# Patient Record
Sex: Female | Born: 1975 | Race: White | Hispanic: Yes | Smoking: Never smoker
Health system: Southern US, Community
[De-identification: ages and names within clinical notes are randomized; demographics above are authoritative.]

## PROBLEM LIST (undated history)

## (undated) HISTORY — PX: LEG SURGERY: SHX1003

---

## 2012-05-29 ENCOUNTER — Encounter: Payer: Self-pay | Admitting: *Deleted

## 2012-05-29 ENCOUNTER — Other Ambulatory Visit: Payer: Self-pay | Admitting: Obstetrics and Gynecology

## 2012-05-29 ENCOUNTER — Ambulatory Visit (HOSPITAL_COMMUNITY)
Admission: RE | Admit: 2012-05-29 | Discharge: 2012-05-29 | Disposition: A | Discharge status: Home / Self Care | Payer: Self-pay | Source: Ambulatory Visit | Attending: Obstetrics and Gynecology | Admitting: Obstetrics and Gynecology

## 2012-05-29 ENCOUNTER — Ambulatory Visit (INDEPENDENT_AMBULATORY_CARE_PROVIDER_SITE_OTHER): Payer: Self-pay | Admitting: *Deleted

## 2012-05-29 VITALS — BP 134/90 | HR 57 | Temp 98.3°F | Ht 61.0 in | Wt 148.6 lb

## 2012-05-29 DIAGNOSIS — Z1231 Encounter for screening mammogram for malignant neoplasm of breast: Secondary | ICD-10-CM

## 2012-05-29 DIAGNOSIS — Z1239 Encounter for other screening for malignant neoplasm of breast: Secondary | ICD-10-CM

## 2012-05-29 NOTE — Patient Instructions (Signed)
Taught patient how to perform BSE. Unable to complete patients Pap smear today due to being on menstrual cycle. Told patient will call to schedule her a Pap smear through [facility] this fall. Let her know [facility] will cover Pap smears every 3 years unless has a history of abnormal Pap smears. Patient escorted to mammography for a screening mammogram. Let patient know will follow up with her within the next couple weeks with results. Patient verbalized understanding.

## 2012-05-29 NOTE — Progress Notes (Signed)
No complaints today.  Pap Smear:   Pap smear not performed today. Per patient last Pap smear was 3 years ago and normal. Unable to complete Pap smear today due to patient being on menstrual cycle. Per patient she has no history of abnormal Pap smears. No Pap smear results in EPIC.  Physical exam: Breasts Breasts symmetrical. No skin abnormalities bilateral breasts. No nipple retraction bilateral breasts. No nipple discharge bilateral breasts. No lymphadenopathy. No lumps palpated bilateral breasts. No complaints of pain or tenderness on exam. Patient has a sister who was diagnosed at 81 with breast cancer and her mother was diagnosed with breast cancer in her 89's.    Pelvic/Bimanual No Pap smear completed today since patient was on menstrual cycle. Will schedule patient Pap smear through [facility] this fall.

## 2012-06-08 ENCOUNTER — Other Ambulatory Visit: Payer: Self-pay | Admitting: Obstetrics and Gynecology

## 2012-06-08 DIAGNOSIS — R928 Other abnormal and inconclusive findings on diagnostic imaging of breast: Secondary | ICD-10-CM

## 2012-06-10 ENCOUNTER — Telehealth: Payer: Self-pay | Admitting: *Deleted

## 2012-06-10 NOTE — Telephone Encounter (Signed)
Telephoned patient and left message to return call [phone].

## 2012-06-15 ENCOUNTER — Ambulatory Visit
Admission: RE | Admit: 2012-06-15 | Discharge: 2012-06-15 | Disposition: A | Discharge status: Home / Self Care | Payer: No Typology Code available for payment source | Source: Ambulatory Visit | Attending: Obstetrics and Gynecology | Admitting: Obstetrics and Gynecology

## 2012-06-15 DIAGNOSIS — R928 Other abnormal and inconclusive findings on diagnostic imaging of breast: Secondary | ICD-10-CM

## 2012-06-18 ENCOUNTER — Telehealth: Payer: Self-pay | Admitting: *Deleted

## 2012-06-18 NOTE — Telephone Encounter (Signed)
Used interpreter Hannah Luna. Telephoned patient at home # and discussed results of mammogram and follow up mammogram in one year. Scheduled pap smear with [facility] Sept 3 9:45. Patient voiced understanding.

## 2012-08-19 ENCOUNTER — Encounter: Payer: Self-pay | Admitting: *Deleted

## 2012-09-01 ENCOUNTER — Other Ambulatory Visit (HOSPITAL_COMMUNITY)
Admission: RE | Admit: 2012-09-01 | Discharge: 2012-09-01 | Disposition: A | Discharge status: Home / Self Care | Payer: No Typology Code available for payment source | Source: Ambulatory Visit | Attending: Obstetrics and Gynecology | Admitting: Obstetrics and Gynecology

## 2012-09-01 ENCOUNTER — Ambulatory Visit (HOSPITAL_COMMUNITY)
Admission: RE | Admit: 2012-09-01 | Discharge: 2012-09-01 | Disposition: A | Discharge status: Home / Self Care | Payer: Self-pay | Source: Ambulatory Visit | Attending: Obstetrics and Gynecology | Admitting: Obstetrics and Gynecology

## 2012-09-01 ENCOUNTER — Other Ambulatory Visit: Payer: Self-pay | Admitting: Obstetrics and Gynecology

## 2012-09-01 DIAGNOSIS — Z01419 Encounter for gynecological examination (general) (routine) without abnormal findings: Secondary | ICD-10-CM | POA: Insufficient documentation

## 2012-09-10 ENCOUNTER — Telehealth (HOSPITAL_COMMUNITY): Payer: Self-pay | Admitting: *Deleted

## 2012-09-10 NOTE — Telephone Encounter (Signed)
Telephoned patient at home # and discussed results of pap smear. Advised pap smear was normal. Patient voiced understanding.

## 2012-10-19 NOTE — Progress Notes (Signed)
See detailed notes scanned under media. 

## 2012-10-19 NOTE — Patient Instructions (Signed)
See detailed notes scanned under media. 

## 2012-12-09 NOTE — Addendum Note (Signed)
Encounter addended by: Saintclair Halsted, RN on: 12/09/2012  2:28 PM<BR>     Documentation filed: Visit Diagnoses, Charges VN

## 2014-05-12 ENCOUNTER — Ambulatory Visit: Payer: Self-pay

## 2014-05-12 ENCOUNTER — Ambulatory Visit: Payer: Self-pay | Admitting: Family Medicine

## 2014-05-12 VITALS — BP 110/76 | HR 73 | Temp 98.2°F | Resp 16 | Ht 61.0 in | Wt 155.4 lb

## 2014-05-12 DIAGNOSIS — M545 Low back pain, unspecified: Secondary | ICD-10-CM

## 2014-05-12 DIAGNOSIS — M549 Dorsalgia, unspecified: Secondary | ICD-10-CM

## 2014-05-12 MED ORDER — OXYCODONE-ACETAMINOPHEN 5-325 MG PO TABS: 1.0000 | ORAL_TABLET | ORAL | Status: DC | PRN

## 2014-05-12 MED ORDER — PREDNISONE 20 MG PO TABS: ORAL_TABLET | ORAL | Status: AC

## 2014-05-12 NOTE — Patient Instructions (Signed)

## 2014-05-12 NOTE — Progress Notes (Addendum)
This chart was scribed for Norberto SorensonEva Jadon Ressler, MD by Beverly MilchJ Harrison Collins, ED Scribe. This patient was seen in room 8 and the patient's care was started at 1:37 PM.  Subjective:    Patient ID: Hannah Luna, female    DOB: 11/12/1976, 38 y.o.   MRN: [id number]  Chief Complaint  Patient presents with  . Back Pain    x 6 days    HPI HPI Comments: Hannah Norma Fredricksonoledo is a 38 y.o. female who presents to the [facility] complaining of lower back pain that began 6 days ago after she slipped on oil falling in the bathroom at work and landing on her back 6 days ago. She reports some tingling in her right lower legs. Pt states her pain worsens with walking specifically when her heel hits the ground. She reports taking Advil and Flanax without significant improvement. 04/23/2014 was her last period.  History reviewed. No pertinent past medical history. Current Outpatient Prescriptions on File Prior to Visit  Medication Sig Dispense Refill  . Multiple Vitamin (MULTIVITAMIN) tablet Take 1 tablet by mouth daily.       No current facility-administered medications on file prior to visit.   No Known Allergies  Review of Systems  Constitutional: Negative for fever and chills.  Gastrointestinal: Negative for nausea, vomiting, abdominal pain, diarrhea and constipation.  Genitourinary: Negative for urgency, frequency, decreased urine volume and difficulty urinating.  Musculoskeletal: Positive for back pain and myalgias. Negative for arthralgias, gait problem and joint swelling.  Neurological: Positive for numbness. Negative for weakness.  Hematological: Negative for adenopathy. Does not bruise/bleed easily.       Objective:   Physical Exam  Nursing note and vitals reviewed. Constitutional: She is oriented to person, place, and time. She appears well-developed and well-nourished. No distress.  HENT:  Head: Normocephalic and atraumatic.  Eyes: Conjunctivae and EOM are normal. Pupils are equal,  round, and reactive to light. No scleral icterus.  Neck: Normal range of motion. Neck supple. No thyromegaly present.  Cardiovascular: Exam reveals no friction rub.   Pulmonary/Chest: Effort normal. No stridor.  Abdominal: Soft.  Musculoskeletal: Normal range of motion. She exhibits no edema.       Lumbar back: She exhibits tenderness (over the spinous process and paraspinal muscles).  Lymphadenopathy:    She has no cervical adenopathy.  Neurological: She is alert and oriented to person, place, and time. She has normal strength. No cranial nerve deficit. She exhibits normal muscle tone. Coordination normal.  Reflex Scores:      Patellar reflexes are 2+ on the right side and 2+ on the left side.      Achilles reflexes are 2+ on the right side and 2+ on the left side. 5/5 strength bilaterally in the lower extremities  Skin: Skin is warm and dry. No rash noted. She is not diaphoretic. No erythema.  Psychiatric: She has a normal mood and affect. Her behavior is normal.     Triage vitals: BP 110/76  Pulse 73  Temp(Src) 98.2 F (36.8 C) (Oral)  Resp 16  Ht 5\' 1"  (1.549 m)  Wt 155 lb 6.4 oz (70.489 kg)  BMI 29.38 kg/m2  SpO2 100%   [facility] reading (PRIMARY) by  Dr. Clelia CroftShaw. L spine: no acute abnormality     Assessment & Plan:   Back pain - Plan: DG Lumbar Spine 2-3 Views  Lumbago  Meds ordered this encounter  Medications  . predniSONE (DELTASONE) 20 MG tablet    Sig:  Take 3 tabs qd x 3d, then 2 tabs x 3d, then 1 tab po qd x 3d.    Dispense:  18 tablet    Refill:  0  . oxyCODONE-acetaminophen (ROXICET) 5-325 MG per tablet    Sig: Take 1-2 tablets by mouth every 4 (four) hours as needed for severe pain.    Dispense:  40 tablet    Refill:  0    I personally performed the services described in this documentation, which was scribed in my presence. The recorded information has been reviewed and considered, and addended by me as needed.  Norberto SorensonEva Lashawna Poche, MD MPH

## 2014-05-19 ENCOUNTER — Ambulatory Visit: Payer: Self-pay | Admitting: Family Medicine

## 2014-05-19 VITALS — BP 116/78 | HR 78 | Temp 98.4°F | Resp 16 | Ht 61.5 in | Wt 154.6 lb

## 2014-05-19 DIAGNOSIS — M549 Dorsalgia, unspecified: Secondary | ICD-10-CM

## 2014-05-19 DIAGNOSIS — M545 Low back pain, unspecified: Secondary | ICD-10-CM

## 2014-05-19 DIAGNOSIS — S39012A Strain of muscle, fascia and tendon of lower back, initial encounter: Secondary | ICD-10-CM

## 2014-05-19 MED ORDER — MELOXICAM 7.5 MG PO TABS: 7.5000 mg | ORAL_TABLET | Freq: Two times a day (BID) | ORAL | Status: AC

## 2014-05-19 MED ORDER — CYCLOBENZAPRINE HCL 10 MG PO TABS: 10.0000 mg | ORAL_TABLET | Freq: Three times a day (TID) | ORAL | Status: AC | PRN

## 2014-05-19 NOTE — Progress Notes (Signed)
Subjective:    Patient ID: Hannah Luna, female    DOB: 05/24/1976, 38 y.o.   MRN: [id number] This chart was scribed for Norberto SorensonEva Shaw, MD by Nicholos Johnsenise Iheanachor, Medical Scribe. The patient was seen in room 14. This patient's care was started at 3:30 PM.  Chief Complaint  Patient presents with  . Follow-up    back pain   HPI HPI Comments: Hannah Luna is a 38 y.o. female who presents to the [facility] for back pain follow up. Seen 7 days previously with lower back pain that began 6 days prior to visit following a fall while at work in the [employer]. Reported tingling in right lower legs. Was placed on Prednisone and Oxycodone to treat. Lumbar spine x-rays performed here 1 week ago in the office that showed normal.  Today presents with same unchanged pain. Reports trouble sleeping with Prednisone. Oxycodone helps minimally then wears off. Last taken yesterday. Is back at work. Went to work for the past 2 days but was told to come here today to receive a list of things she can and cannot do at work. Is not using heat or ice to treat symptoms. Denies bowel/bladder trouble, abdominal pain.   There are no active problems to display for this patient.  No past medical history on file. Past Surgical History  Procedure Laterality Date  . Leg surgery     No Known Allergies Prior to Admission medications   Medication Sig Start Date End Date Taking? Authorizing Provider  Multiple Vitamin (MULTIVITAMIN) tablet Take 1 tablet by mouth daily.   Yes Historical Provider, MD  oxyCODONE-acetaminophen (ROXICET) 5-325 MG per tablet Take 1-2 tablets by mouth every 4 (four) hours as needed for severe pain. 05/12/14  Yes Sherren MochaEva N Shaw, MD  predniSONE (DELTASONE) 20 MG tablet Take 3 tabs qd x 3d, then 2 tabs x 3d, then 1 tab po qd x 3d. 05/12/14  Yes Sherren MochaEva N Shaw, MD   History   Social History  . Marital Status: Unknown    Spouse Name: N/A    Number of Children: N/A  . Years of Education: N/A    Occupational History  . Not on file.   Social History Main Topics  . Smoking status: Never Smoker   . Smokeless tobacco: Never Used  . Alcohol Use: No  . Drug Use: No  . Sexual Activity: Yes    Birth Control/ Protection: None   Other Topics Concern  . Not on file   Social History Narrative  . No narrative on file   Review of Systems  Constitutional: Negative for fever and chills.  HENT: Negative for rhinorrhea and sore throat.   Respiratory: Negative for cough and shortness of breath.   Gastrointestinal: Negative for abdominal pain.  Musculoskeletal: Positive for back pain and myalgias.  Skin: Negative for color change and rash.  Neurological: Negative for weakness and headaches.  Psychiatric/Behavioral: Negative for behavioral problems.   BP 116/78  Pulse 78  Temp(Src) 98.4 F (36.9 C) (Oral)  Resp 16  Ht 5' 1.5" (1.562 m)  Wt 154 lb 9.6 oz (70.126 kg)  BMI 28.74 kg/m2  SpO2 100% Objective:  Physical Exam  Nursing note and vitals reviewed. Constitutional: She is oriented to person, place, and time. She appears well-developed and well-nourished. No distress.  HENT:  Head: Normocephalic and atraumatic.  Eyes: EOM are normal.  Neck: Neck supple. No tracheal deviation present.  Cardiovascular: Normal rate.   Pulmonary/Chest: Effort normal. No  respiratory distress.  Musculoskeletal: Normal range of motion.       Lumbar back: She exhibits tenderness and spasm.  Point tenderness over lower lumbar spine and paraspinal with muscle spasms. 5/5 strength bilaterally on all lower extremities.   Neurological: She is alert and oriented to person, place, and time.  Reflex Scores:      Patellar reflexes are 2+ on the right side and 2+ on the left side.      Achilles reflexes are 2+ on the right side and 2+ on the left side. Skin: Skin is warm and dry.  Psychiatric: She has a normal mood and affect. Her behavior is normal.   Assessment & Plan:   Lumbar strain  Meds  ordered this encounter  Medications  . cyclobenzaprine (FLEXERIL) 10 MG tablet    Sig: Take 1 tablet (10 mg total) by mouth 3 (three) times daily as needed for muscle spasms.    Dispense:  30 tablet    Refill:  0  . meloxicam (MOBIC) 7.5 MG tablet    Sig: Take 1 tablet (7.5 mg total) by mouth 2 (two) times daily.    Dispense:  60 tablet    Refill:  0    I personally performed the services described in this documentation, which was scribed in my presence. The recorded information has been reviewed and considered, and addended by me as needed.  Norberto SorensonEva Shaw, MD MPH

## 2014-05-19 NOTE — Patient Instructions (Signed)
Start the flexeril as needed for muscle spasms.  AFTER the course of prednisone is complete, start taking the meloxicam daily.  Make sure you are placing heat against your low back 20 minutes 4 times/d.  Gentle stretching and massage.  If you are worsening come back immediately. If you are still having any pain in 1 month, please come back as we may need to discuss specialist referral or physical therapy or further imaging Distensin de la cintura con rehabilitacin (Low Back Strain with Rehab) Hannah Luna distensin es una lesin en la que el tendn o msculo se rasga. Los msculos y tendones de la cintura son susceptibles de sufrir distensiones. Sin embargo, estos msculos y tendones son Lynnae Sandhoffmuy fuertes y se requiere de una gran fuerza para lesionarlos. Los esguinces se clasifican en tres categoras. En el esguince de grado 1 hay dolor, pero el tendn no est estirado. En los esguinces de grado 2 hay un ligamento alargado, debido a un estiramiento o desgarro parcial. En el esguince de grado 2 an hay funcionamiento, aunque ste puede disminuir. Una distensin en grado 3 es la ruptura completa del msculo o el tendn, y suele quedar incapacitada la funcin. SNTOMAS  Dolor en la cintura.  Dolor en la espalda, que a menudo afecta ms a un lado que al CIGNAotro.  Dolor que empeora con los movimientos y se siente en las caderas, las nalgas o la zona posterior del muslo.  Espasmos en los msculos de la espalda.  Hinchazn a lo largo de los msculos de la espalda.  Prdida de la fuerza en estos msculos.  Ruido de "crack" (crepitacin) al tocarlos. CAUSAS  La distensin se produce cuando se coloca una fuerza en el msculo o tendn que es mayor de lo que puede soportar. Las causas ms frecuentes de la lesin son:  Benedetto GoadUso excesivo prolongado de la unidad msculo - tendn en la zona de la cintura, generalmente debido a Landscape architectuna postura incorrecta.  Una nica lesin o fuerza violenta aplicada en la espalda. LOS RIESGOS  AUMENTAN CON  Cualquier deporte en el que el movimiento ocasione una fuerza de giro en la espalda o una gran inclinacin de la cintura; (ftbol americano, rugby, levantamiento de pesas, bowling, golf, tenis, patinaje, deportes con raqueta, natacin, carrera, gimnasia, clavados).  Poca fuerza y flexibilidad.  No hacer un precalentamiento adecuado.  Historia familiar de dolor de cintura o trastornos en discos.  Cirugas previas en la espalda (especialmente fusin).  Tcnica incorrecta al levantar objetos pesados.  Permanecer largo tiempo sentado, especialmente de manera incorrecta. MEDIDAS DE PREVENCIN  Aprenda y Child psychotherapistutilice la mecnica correcta al sentarse o al levantarse (mantener la postura correcta al sentarse; levantar objetos inclinando las rodillas y las piernas y no la cintura).  Precalentamiento adecuado y elongacin antes de la Loomisactividad.  Descanso y recuperacin entre actividades.  Mantener la forma fsica:  Earma ReadingFuerza, flexibilidad y resistencia muscular.  Capacidad cardiovascular. PRONSTICO Si se trata adecuadamente, generalmente es curable dentro de las 6 semanas. POSIBLES COMPLICACIONES:  La recurrencia frecuente de los sntomas puede dar como resultado un problema crnico.  La inflamacin crnica, degeneracin cicatrizal del tendn y ruptura parcial del tendn.  Demora de la curacin o de la resolucin de los sntomas.  Discapacidad prolongada. CONSIDERACIONES CSX CorporationENERALES PARA EL TRATAMIENTO El tratamiento inicial incluye el uso de medicamentos y la aplicacin de hielo para reducir Chief Technology Officerel dolor y la inflamacin. Los ejercicios de elongacin y fortalecimiento pueden ayudar a reducir Chief Technology Officerel dolor con la Calexicoactividad. Los ejercicios pueden Insurance claims handlerrealizarse en el  hogar o con un terapeuta. Los Liz Claibornecasos graves pueden requerir la derivacin a un fisioterapeuta para Magazine features editorrealizar una evaluacin y Games developercomenzar un tratamiento, como ultrasonido. El profesional podr indicarle el uso de un dispositivo  ortopdico para ayudar a Glass blower/designerreducir el dolor y la inflamacin. A menudo, demasiado reposo en cama podr resultar en ms daos que beneficios. Podrn prescribirle inyecciones de corticoides. Sin embargo, esto deber reservarse para los casos ms graves. Es Therapist, occupationalimportante evitar el uso de la espalda cuando se levantan objetos. Por la noche, se aconseja que usted Djiboutiduerma boca arriba, sobre un [address]colchn firme y coloque una almohada debajo de las rodillas. Si no se obtiene xito con Artistel tratamiento conservador, ser necesario someterse a [place] and Herzegovinauna ciruga.  MEDICAMENTOS   Si es necesaria la administracin de medicamentos para Chief Technology Officerel dolor, se recomiendan los antiinflamatorios no esteroides, como aspirina e ibuprofeno y otros calmantes menores, como acetaminofeno.  No tome medicamentos para el dolor dentro de los [address] das previos a la Azerbaijanciruga.  El profesional podr prescribirle calmantes si lo considera necesario. Utilcelos como se le indique y slo cuando lo necesite.  Podr beneficiarse con Teachers Insurance and Annuity Associationpomadas sobre la piel.  En algunos casos se indica una inyeccin de corticosteroides. Estas inyecciones deben reservarse para los New Brendacasos graves, porque slo se pueden administrar una determinada cantidad de veces. CALOR Y FRO   El fro (con hielo) debe aplicarse durante 10 a 15 minutos cada 2  3 horas para reducir la inflamacin y Chief Technology Officerel dolor e inmediatamente despus de cualquier actividad que agrava los sntomas. Utilice bolsas o un masaje de hielo.  El calor puede usarse antes de Therapist, musicelongar y de las actividades de fortalecimiento indicadas por el profesional, le fisioterapeuta o Orthoptistel entrenador. Utilice una bolsa trmica o un pao hmedo. SOLICITE ATENCIN MDICA SI:   Los sntomas empeoran o no mejoran en 2 a 4 semanas, an realizando Pharmacist, communityun tratamiento.  Siente adormecimiento, debilitamiento o prdida de control de la vejiga o el intestino.  Desarrolla nuevos e inexplicables sntomas. (Los medicamentos indicados en el tratamiento le  ocasionan efectos secundarios). EJERCICIOS  EJERCICIOS DE AMPLITUD DE MOVIMIENTOS Cherlynn JuneY ELONGACIN Distensin de cintura La mayora de las personas con dolor de espalda baja encuentran que sus sntomas empeoran al doblarse hacia adelante (flexin) o al arquear la regin inferior de la espalda (extensin). Los ejercicios que le ayudarn a Oncologistresolver sus sntomas se Research scientist (life sciences)centrarn en el movimiento contrario.  El mdico, fisioterapeuta o Research scientist (physical sciences)entrenador le ayudarn a Chief Strategy Officerdeterminar qu ejercicios sern de ayuda para resolver su dolor de espalda. No realice ningn ejercicio sin consultarlo antes con el profesional. Discontinue los ejercicios que empeoran sus sntomas, hasta que hable con el mdico. Si siente dolor, entumecimiento u hormigueo que Texas Instrumentsirradia hacia los glteos, piernas o pies, el objetivo de esta terapia es que estos sntomas se acerquen a la espalda y Customer service managereventualmente desaparezcan. A veces, estos sntomas en las piernas mejoran, pero el dolor de espalda empeora. Este suele ser un indicio de progreso en su rehabilitacin. Asegrese de que estar atento a cualquier cambio en sus sntomas y las actividades que ha KB Home	[place] Angeleshecho en las 24 horas antes del cambio. Compartir esta informacin con su mdico le permitir mayor eficiencia para tratar su enfermedad.  Estos ejercicios le ayudarn en la recuperacin de la lesin. Los sntomas podrn aliviarse con o sin asistencia adicional de su mdico, fisioterapeuta o Herbalistentrenador. Al completar estos ejercicios, recuerde:  Restaurar la flexibilidad del tejido ayuda a que las articulaciones recuperen el movimiento normal. Esto permite que el movimiento y la Beale AFBactividad  sea ms saludables y [employer].  Para que sea efectiva, cada elongacin debe realizarse durante al menos 30 segundos.  La elongacin nunca debe ser dolorosa. Deber sentir slo un alargamiento o distensin suave del tejido que estira. EJERCICIOS DE AMPLITUD DE MOVIMIENTOS Y ELONGACIN: ELONGACION Flexin - una rodilla al  pecho  Recustese en una cama dura o sobre el piso, con ambas piernas extendidas al frente.  Manteniendo una pierna en contacto con el piso, lleve la rodilla opuesta al pecho. Mantenga la pierna en esa posicin, sostenindola por la zona posterior del muslo o por la rodilla.  Presione hasta sentir un suave estiramiento en la cintura. Mantenga esta posicin durante __________ segundos.  Libere la pierna lentamente y repita el ejercicio con el lado opuesto. Reptalo __________ veces. Realice este ejercicio __________ veces por da.  ELONGACIN Flexin, dos rodillas al pecho   Recustese en una cama dura o sobre el piso, con ambas piernas extendidas al frente.  Manteniendo una pierna en contacto con el piso, lleve la rodilla opuesta al pecho.  Tense los msculos del estmago para apoyar la espalda y levante la otra rodilla Joseph. Mantenga las piernas en su lugar y tmese por detrs de las caderas o las rodillas.  Con ambas rodillas en el pecho, tire hasta que sienta un estiramiento en la parte trasera de la espalda. Mantenga esta posicin durante __________ segundos.  Tense los msculos del estmago y baje las piernas de a una por vez. Reptalo __________ veces. Realice este ejercicio __________ veces por da.  ELONGACIN Rotacin de la zona baja del tronco  Recustese sobre una cama firme o sobre el suelo. Mantenga las piernas al frente, doble las rodillas de modo que ambas apunten hacia el techo y los pies queden bien apoyados en el piso.  Extienda los brazos a Dance movement psychotherapist. Esto estabilizar la zona superior del cuerpo, manteniendo los hombros en contacto con el piso.  Suave y lentamente deje caer ambas rodillas juntas hacia un lado, hasta sentir un ligero estiramiento en la cintura. Mantenga esta posicin durante __________ segundos.  Tensione los Exelon Corporation del estmago para Occupational psychologist la cintura mientras lleva las rodillas nuevamente a la posicin inicial. Repita el ejercicio hacia  el otro lado. Reptalo __________ veces. Realice este ejercicio __________ veces por da. EJERCICIOS DE AMPLITUD DE MOVIMIENTOS Y FLEXIBILIDAD: ELONGACIN Extensin posicin prona sobre los codos  Acustese sobre el estmago sobre el piso, una cama ser muy blanda. Coloque las palmas a una distancia igual al ancho de los hombres y a la altura de la cabeza.  Coloque los codos bajo los hombros. Si siente dolor, colquese almohadas debajo del pecho.  Deje que su cuerpo se relaje, de modo que las caderas queden ms abajo y tengan ms contacto con el piso.  Mantenga esta posicin durante __________ segundos.  Vuelva lentamente a la posicin plana sobre el piso. Reptalo __________ veces. Realice este ejercicio __________ veces por da.  AMPLITUD DE MOVIMIENTOS - Extensin - flexin de brazos en posicin prona  Acustese sobre el [employer] Home	[place] piso, una cama ser Amity. Coloque las palmas a una distancia igual al ancho de los hombres y a la altura de la cabeza.  Mantenga la espalda tan relajada como pueda, enderece lentamente los codos [address] las caderas contra el suelo. Puede modificar la posicin de las manos para estar ms cmodo. A medida que gana movimiento, sus manos quedarn ms por debajo de los hombros.  Mantenga cada posicin durante __________segundos.  Vuelva lentamente a la posicin plana sobre el piso. Reptalo __________ veces. Realice este ejercicio __________ veces por da.  AMPLITUD DE MOVIMIENTOS Cuadrpedo [place] vertebral neutral  Coloque las manos y las rodillas en una superficie firme. Las manos deben quedar a la altura de los hombros y las rodillas debajo de las caderas. Puede colocar algo debajo las rodillas para estar ms cmodo.  Haga caer la cabeza y apunte el cccix hacia el suelo debajo de usted. De este modo se redondear la cintura, en [place] similar a un gato enojado. Mantenga esta posicin durante __________ segundos.  Lentamente  levante la cabeza y afloje el cccix para que se hunda el cuerpo en un gran arco, como un caballo.  Mantenga esta posicin durante __________ segundos.  Reptalo hasta sentir calor en la cintura.  Ahora encuentre su "punto ideal". Ser la posicin ms cmoda [employer]. En esta posicin es cuando su columna est neutral. Una vez que encuentre la posicin, tensione los msculos del estmago para sostener la zona inferior de la espalda.  Mantenga esta posicin durante __________ segundos. Reptalo __________ veces. Realice este ejercicio __________ veces por da.  EJERCICIOS DE FORTALECIMIENTO - Distensin de la cintura Estos ejercicios le ayudarn en la recuperacin de la lesin. Estos ejercicios deben hacerse cerca de su "punto dulce". Este es el arco neutro, de la parte baja de la espalda, en algn lugar entre la posicin completamente redondeada y arqueada plenamente, que es la posicin menos dolorosa. Cuando se realiza en [employer] de seguridad del movimiento, estos ejercicios se pueden Chemical engineer para las personas que tienen una lesin basada en flexin o extensin. Con estos ejercicios, los sntomas podrn desaparecer con o sin mayor intervencin del profesional, el fisioterapeuta o Orthoptist. Al completar estos ejercicios, recuerde:   Los msculos pueden ganar la resistencia y la fuerza necesarias para las actividades diarias a travs de ejercicios controlados.  Realice los ejercicios como se lo indic el mdico, el fisioterapeuta o Orthoptist. Aumente la resistencia y las repeticiones segn se le haya indicado.  Podr experimentar dolor o cansancio muscular, pero el dolor o molestia que trata de eliminar a travs de los ejercicios nunca debe empeorar. Si el dolor empeora, detngase y asegrese de que est siguiendo las directivas correctamente. Si an siente dolor luego de Education officer, environmental lo ajustes necesarios, deber discontinuar el ejercicio hasta que pueda conversar  con el profesional sobre el problema. FORTALECIMIENTO - Abdominales profundos - Inclinacin plvica  Recustese sobre una cama firme o sobre el suelo. Mantenga las piernas al frente, doble las rodillas de modo que ambas apunten hacia el techo y los pies queden bien apoyados en el piso.  Tensione la zona baja de los msculos abdominales para presionar la Oncologist. Este movimiento har rotar su pelvis de modo que el cccix quede hacia arriba y no apuntando a los pies o hacia el piso.  Con una tensin suave y respiracin pareja, mantenga esta posicin durante __________ segundos. Reptalo __________ veces. Realice este ejercicio __________ veces por da.  FORTALECIMIENTO Abdominales encogimiento abdominal  Recustese sobre una cama firme o sobre el suelo. Mantenga las piernas al frente, doble las rodillas de modo que ambas apunten hacia el techo y los pies queden bien apoyados en el piso. Cruce las Google.  Apunte suavemente con la barbilla hacia abajo, sin doblar el cuello.  Tensione los abdominales y eleve lentamente el tronco la altura suficiente para despegar los omplatos. Si se  eleva ms, pondr tensin excesiva en la cintura y esto no fortalecer ms los abdominales.  Controle la vuelta a la posicin inicial. Reptalo __________ veces. Realice este ejercicio __________ veces por da.  EN CUATRO MIEMBROS Cuadrpedo, elevacin de miembro superior e inferior opuestos   [employer] y las rodillas en una superficie firme. Las manos deben quedar a la altura de los hombros y las rodillas debajo de las caderas. Puede colocar algo debajo las rodillas para estar ms cmodo.  Encuentre la posicin neutral de la columna vertebral y Public house manager los msculos abdominales de modo que pueda mantener esta posicin. Los hombros y las caderas deben formar un rectngulo paralelo con el suelo y recto.  Manteniendo el tronco firme, eleve la mano derecha a la altura  del hombro y luego eleve la pierna izquierda a la altura de la cadera. Asegrese de no contener la respiracin. Mantenga cada posicin durante __________ segundos.  Con los msculos abdominales en tensin y la espalda firme, vuelva lentamente a la posicin inicial. Repita con el otro brazo y la otra pierna. Reptalo __________ veces. Realice este ejercicio __________ veces por da.  FORTALECIMIENTO Abdominales bajos Elevacin de ambas rodillas  Recustese sobre una cama firme o sobre el suelo. Mantenga las piernas al frente, doble las rodillas de modo que ambas apunten hacia el techo y los pies queden bien apoyados en el piso.  Tensione los msculos abdominales que se encuentran en la cintura y eleve lentamente ambas rodillas hasta que queden sobre las caderas. Asegrese de no contener la respiracin.  Mantenga esta posicin durante __________ segundos. Usando los abdominales, regrese a la posicin inicial en un modo lento y controlado. Reptalo __________ veces. Realice este ejercicio __________ veces por da.  CONSIDERACIONES ACERCA DE LA POSTURA Y LA MECNICA DEL CUERPO Ditensin de la cintura Si mantiene una postura correcta cuando se encuentre de pie, sentado o realizando sus actividades, reducir el estrs Teachers Insurance and Annuity Association diferentes tejidos del cuerpo, y Acupuncturist a los tejidos lesionados la posibilidad de curarse y Film/video editor las experiencias dolorosas. A continuacin se indican pautas generales para mejorar la postura- Su mdico o fisioterapeuta le dar instrucciones especficas segn sus necesidades. Al leer estas pautas recuerde:  Los ejercicios indicados por su mdico lo ayudarn a Chartered certified accountant flexibilidad y la fuerza para Pharmacologist las posturas correctas.  La postura correcta proporciona el mejor entorno de trabajo para las articulaciones. Las articulaciones se desgastan menos cuando estn sostenidas adecuadamente por una columna vertebral en buena postura. Esto significa que su cuerpo estar ms sano  y Research officer, trade union.  La correcta postura debe practicarse en todas las actividades, especialmente al estar sentado o de pie durante [place]. Tambin es importante al realizar actividades repetitivas de bajo estrs (tipeo) o una actividad nica y pesada. POSICIONES DE Dellie Catholic Tenga en cuenta cules son las posturas que ms dolor le causan al elegir una posicin de descanso. Si siente dolor con las actividades en que deba realizar una flexin (sentarse, inclinarse, detenerse, ponerse en cuclillas), elija una posicin que le permita descansar en una postura menos flexionada. Evite curvarse en posicin fetal cuando se encuentre de lado. Si el dolor empeora con las actividades basadas en la extensin (estar de pie durante un tiempo prolongado, trabajar con las manos por arriba de la cabeza) evite descansar en Hannah Dear posicin extendida durante mucho tiempo, como dormir sobre el [place]. La [employer] de las personas encontrar cmodo el descanso sobre la columna vertebral en una posicin neutral, ni muy redondeada ni  muy arqueada. Recustese sobre su lado en una cama que no est hundida con una almohada entre las rodillas o sobre la espalda con una almohada bajo las rodillas, y sentir [place]. Tenga en cuenta que cualquier posicin en Google, no importa si es una postura [place], puede provocarle rigidez. POSTURAS CORRECTAS PARA SENTARSE Con el fin de minimizar el estrs y Environmental health practitioner en su columna, deber sentarse con la postura correcta. Sentarse con una buena postura debe ser algo sin esfuerzo para un cuerpo sano. Recuperar una buena postura es un proceso gradual. Muchas personas pueden trabajar ms cmodas mediante el uso de diferentes soportes hasta que tengan la flexibilidad y la fuerza para mantener esta postura por su cuenta. Al sentarse con la [employer], los odos deben estar sobre los hombros y los hombros sobre las caderas. Debe utilizar el respaldo de la silla para apoyar la espalda.  La espalda estar en una posicin neutral, ligeramente arqueada. Puede colocar una pequea almohada o toalla doblada en la base de la espalda baja para apoyo.  Si trabaja en un escritorio, cree un ambiente que le proporciones un buen soporte y [place]. Sin apoyo adicional, msculos se cansan, lo que lleva a una tensin excesiva en las articulaciones y otros tejidos. Tenga en cuenta estas recomendaciones: SILLA:   La silla debe poder deslizarse por debajo del escritorio cuando su espalda tome contacto con el respaldo. Esto le permitir trabajar ms cerca.  La altura de la silla debe permitirle que los ojos tengan el nivel de la parte superior del monitor y las manos estn ms abajo que los codos. POSICIN DEL CUERPO  Los pies deben tener contacto con el piso. Si no es posible, use un posapies.  Mantenga las [employer] hombros. Esto reducir el estrs en el cuello y en la cintura. POSTURAS INCORRECTAS PARA SENTARSE  Si se siente cansado e incapaz de asumir una postura sentada sana, no se eche hacia atrs. Esto pone una tensin excesiva en los tejidos de su espalda, y causa ms dao y Engineer, mining. Entre las opciones ms saludables se incluyen:  El uso de ms apoyo, como una almohada lumbar.  Cambio de tareas, a algo que demande una posicin vertical o caminar.  Tomar una breve caminata.  Recostarse y Theatre stage manager posicin neutral. DE PIE DURANTE UN TIEMPO PROLONGADO E INCLINADO LIGERAMENTE HACIA ADELANTE Cuando deba realizar una tarea que requiera inclinacin hacia adelante estando de pie en el mismo sitio durante mucho tiempo, coloque un pie en un objeto de 2 a 4 pulgadas de alto, para [employer]. Cuando ambos pies estn en el piso, la zona inferior de la espalda tiene a perder su ligera curvatura hacia adentro. Si esta curva se aplana (o se pronuncia demasiado) la espalda y las articulaciones experimentarn demasiado estrs, se fatigarn ms rpidamente y  Geophysicist/field seismologist. POSTURAS CORRECTAS PARA ESTAR DE PIE Una postura adecuada de pie realizarse en todas las actividades diarias, incluso si slo toman un momento, como al [employer]. Como en la postura de sentado, los odos deben estar sobre los hombros y los hombros sobre las caderas. Deber mantener una ligera tensin en sus msculos abdominales para asegurar la columna vertebral. El cccix debe apuntar hacia el suelo, no detrs de su cuerpo, ya que resultara en una curvatura de la espalda sobre-extendida.  POSTURAS INCORRECTAS PARA ESTAR DE PIE Las posturas incorrectas para estar de pie incluyen tener la cabeza hacia delante, las rodillas bloqueadas o Cement  excesiva curvatura de la espalda. CAMINAR Camine en Deborha Payment erguida. Las [place], hombros y caderas deben estar alineados. ACTIVIDAD PROLONGADA EN UNA POSICIN FLEXIONADA Al completar una tarea que requiere que se doble la cintura hacia adelante o inclinarse sobre una superficie baja, trate de encontrar una manera de estabilizar 3 de cada 4 de sus miembros. Puede colocar una mano o el codo en el [place], o descansar una rodilla en la superficie en la que est apoyado. Esto le proporcionar ms estabilidad para que sus msculos no se cansen tan rpidamente. El [employer] Corporation rodillas [place], o ligeramente dobladas, tambin reducir el estrs en la espalda baja. TCNICAS CORRECTAS PARA LEVANTAR OBJETOS SI:   Asumir una postura amplia. Esto le proporcionar ms estabilidad y la oportunidad de acercarse lo ms posible al objeto que se est levantando.  Tense los abdominales para asegurar la columna vertebral. Doble las rodillas y las caderas. Manteniendo la espalda en una posicin neutral, haga el esfuerzo con los msculos de la pierna. Levntese con las piernas, manteniendo la espalda derecha.  Pruebe el peso de los objetos desconocidos antes de tratar de Secondary school teacher.  Trate de [employer] codos hacia abajo y a los lados, con el  fin de obtener la fuerza de los hombros al llevar un objeto.  Siempre pida ayuda a otra persona cuando deba levantar objetos pesados o incmodos. TCNICAS INCORRECTAS PARA LEVANTAR OBJETOS NO:   Bloquee rodillas al levantar, aunque sea un objeto pequeo.  Se doble ni gire. Gire sobre los pies ni los mueva cuando necesite cambiar de direccin.  Considere que no puede levantar incluso un clip de papel con seguridad, sin Clinical biochemist. Document Released: 10/02/2006 Document Revised: 03/09/2012 [facility] Patient Information 2014 [place], [place].

## 2014-08-05 DIAGNOSIS — Z0271 Encounter for disability determination: Secondary | ICD-10-CM

## 2014-09-01 ENCOUNTER — Other Ambulatory Visit (HOSPITAL_COMMUNITY): Payer: Self-pay | Admitting: *Deleted

## 2014-09-01 DIAGNOSIS — N6320 Unspecified lump in the left breast, unspecified quadrant: Secondary | ICD-10-CM

## 2014-09-03 ENCOUNTER — Inpatient Hospital Stay (HOSPITAL_COMMUNITY)
Admission: AD | Admit: 2014-09-03 | Discharge: 2014-09-03 | Disposition: A | Discharge status: Home / Self Care | Payer: Self-pay | Source: Ambulatory Visit | Attending: Family Medicine | Admitting: Family Medicine

## 2014-09-03 ENCOUNTER — Encounter (HOSPITAL_COMMUNITY): Payer: Self-pay

## 2014-09-03 ENCOUNTER — Other Ambulatory Visit: Payer: Self-pay | Admitting: Medical

## 2014-09-03 DIAGNOSIS — Z803 Family history of malignant neoplasm of breast: Secondary | ICD-10-CM | POA: Insufficient documentation

## 2014-09-03 DIAGNOSIS — N63 Unspecified lump in unspecified breast: Secondary | ICD-10-CM | POA: Insufficient documentation

## 2014-09-03 DIAGNOSIS — N6322 Unspecified lump in the left breast, upper inner quadrant: Secondary | ICD-10-CM

## 2014-09-03 DIAGNOSIS — Z800 Family history of malignant neoplasm of digestive organs: Secondary | ICD-10-CM | POA: Insufficient documentation

## 2014-09-03 DIAGNOSIS — N644 Mastodynia: Secondary | ICD-10-CM

## 2014-09-03 MED ORDER — OXYCODONE-ACETAMINOPHEN 5-325 MG PO TABS: 1.0000 | ORAL_TABLET | ORAL | Status: AC | PRN

## 2014-09-03 NOTE — Progress Notes (Signed)
Attempted to place orders for mammogram and breast US and message that this is a duplicate order. Patient has an appointment scheduled with [facility] for mammogram and [place] on 09/20/14.   Marny Lowenstein, PA-C 09/03/2014 3:34 PM

## 2014-09-03 NOTE — MAU Provider Note (Signed)
  History     CSN: [id number]  Arrival date and time: 09/03/14 1200   First Provider Initiated Contact with Patient 09/03/14 1223      Chief Complaint  Patient presents with  . Breast Pain   HPI [name] is a 38 y.o. G1P1001 who presents to MAU today with complaint of left breast lump since 08/24/14. She states that it is tender to palpation and worse today. She denies redness, swelling, drainage from the lump. She had a mammogram 2 years ago that was normal. She is concerned because her sister died of breast cancer 9 months ago and mom had colon cancer. She denies fever. She is not breastfeeding.   OB History   Grav Para Term Preterm Abortions TAB SAB Ect Mult Living   No past medical history on file.  Past Surgical History  Procedure Laterality Date  . Leg surgery      Family History  Problem Relation Age of Onset  . Breast cancer Mother   . Breast cancer Sister     History  Substance Use Topics  . Smoking status: Never Smoker   . Smokeless tobacco: Never Used  . Alcohol Use: No    Allergies: No Known Allergies  Prescriptions prior to admission  Medication Sig Dispense Refill  . cyclobenzaprine (FLEXERIL) 10 MG tablet Take 1 tablet (10 mg total) by mouth 3 (three) times daily as needed for muscle spasms.  30 tablet  0  . meloxicam (MOBIC) 7.5 MG tablet Take 1 tablet (7.5 mg total) by mouth 2 (two) times daily.  60 tablet  0  . Multiple Vitamin (MULTIVITAMIN) tablet Take 1 tablet by mouth daily.      Marland Kitchen oxyCODONE-acetaminophen (ROXICET) 5-325 MG per tablet Take 1-2 tablets by mouth every 4 (four) hours as needed for severe pain.  40 tablet  0  . predniSONE (DELTASONE) 20 MG tablet Take 3 tabs qd x 3d, then 2 tabs x 3d, then 1 tab po qd x 3d.  18 tablet  0    Review of Systems  Constitutional: Negative for fever and malaise/fatigue.  Gastrointestinal: Negative for abdominal pain.  Skin: Negative for rash.   Physical Exam    There were no vitals taken for this visit.  Physical Exam  Constitutional: She is oriented to person, place, and time. She appears well-developed and well-nourished. No distress.  HENT:  Head: Normocephalic.  Cardiovascular: Normal rate.   Respiratory: Effort normal. Left breast exhibits mass and tenderness. Left breast exhibits no inverted nipple, no nipple discharge and no skin change.    Genitourinary: No breast swelling, discharge or bleeding.  Neurological: She is alert and oriented to person, place, and time.  Skin: Skin is warm and dry. No erythema.  Psychiatric: She has a normal mood and affect.    MAU Course  Procedures None  MDM Referral to Breast Center  Assessment and Plan  A: Breast lump  P: Discharge home Rx for Percocet given to patient [facility] Advised patient to use warm compress to the area Discussed warning signs for infection and abscess Patient may return to MAU as needed or if her condition were to change or worsen   Hannah Lowenstein, PA-C  09/03/2014, 12:25 PM

## 2014-09-03 NOTE — MAU Note (Signed)
Pt states via Shanda Bumps, spanish interpreter that she began noticing lump on r breast a week ago. Now pt is tearful, states very painful. Able to palpate lump however no redness or drainage noted. No fever.

## 2014-09-03 NOTE — Discharge Instructions (Signed)
Sensibilidad en las mamas °(Breast Tenderness) °La sensibilidad en las mamas es un problema frecuente en las mujeres de todas las edades. y puede causar molestias leves o dolor intenso. Sus causas son variadas. Su médico determinará la causa probable de la sensibilidad mediante el examen de las mamas, las preguntas sobre los síntomas y la indicación de algunos estudios. Por lo general, la sensibilidad en las mamas no significa que tenga cáncer de mama. °INSTRUCCIONES PARA EL CUIDADO EN EL HOGAR  °A menudo, la sensibilidad en las mamas puede tratarse en el hogar. Puede intentar lo siguiente: °· Probarse un nuevo sostén que le brinde más sujeción, especialmente mientras hace actividad física. °· Usar un sostén con mejor sujeción o uno deportivo mientras duerme cuando las mamas están muy sensibles. °· Si tiene una lesión mamaria, aplique hielo en la zona: °¨ Ponga el hielo en una bolsa plástica. °¨ Colóquese una toalla entre la piel y la bolsa de hielo. °¨ Deje el hielo durante 20 minutos y aplíquelo 2 a 3 veces por día. °· Si tiene las mamas repletas de leche debido a la lactancia, intente lo siguiente: °¨ Extráigase leche manualmente o con un sacaleche. °¨ Aplíquese una compresa tibia en las mamas para ayudar a la descarga. °· Tome analgésicos de venta libre si su médico lo autoriza. °· Tome otros medicamentos que su médico le recete, entre ellos, antibióticos o anticonceptivos. °A largo plazo, puede aliviar la sensibilidad en las mamas si hace lo siguiente: °· Disminuye el consumo de cafeína. °· Disminuye la cantidad de grasa de la dieta. °Lleva un registro de los días y las horas cuando tiene mayor sensibilidad en las mamas. Esto será de ayuda para que usted y su médico encuentren la causa de la sensibilidad y cómo aliviarla. Además, aprenda cómo examinarse las mamas en casa. Esto la ayudará a palpar un crecimiento o un bulto fuera de lo normal que podría causar la sensibilidad. °SOLICITE ATENCIÓN MÉDICA SI:   °· Cualquier zona de la mama está dura, enrojecida y caliente al tacto. Puede ser un signo de infección. °· Hay secreción de los pezones (y no está amamantando). En especial, vigile la secreción de sangre o pus. °· Tiene fiebre, además de sensibilidad en las mamas. °· Tiene un bulto nuevo o doloroso en la mama que no desaparece después de la finalización del período menstrual. °· Ha intentando controlar el dolor en casa, pero no desaparece. °· El dolor de la mama es más intenso o le dificulta hacer las cosas que hace habitualmente durante el día. °Document Released: 10/06/2013 °ExitCare® Patient Information ©2015 [employer]. This information is not intended to replace advice given to you by your health care provider. Make sure you discuss any questions you have with your health care provider. ° °

## 2014-09-03 NOTE — MAU Provider Note (Signed)
Attestation of Attending Supervision of Advanced Practitioner (PA/CNM/NP): Evaluation and management procedures were performed by the Advanced Practitioner under my supervision and collaboration.  I have reviewed the Advanced Practitioner's note and chart, and I agree with the management and plan.  Reva Bores, MD [facility] Attending 09/03/2014 1:06 PM

## 2014-09-20 ENCOUNTER — Ambulatory Visit
Admission: RE | Admit: 2014-09-20 | Discharge: 2014-09-20 | Disposition: A | Discharge status: Home / Self Care | Payer: No Typology Code available for payment source | Source: Ambulatory Visit | Attending: Obstetrics and Gynecology | Admitting: Obstetrics and Gynecology

## 2014-09-20 ENCOUNTER — Ambulatory Visit (HOSPITAL_COMMUNITY)
Admission: RE | Admit: 2014-09-20 | Discharge: 2014-09-20 | Disposition: A | Discharge status: Home / Self Care | Payer: No Typology Code available for payment source | Source: Ambulatory Visit | Attending: Obstetrics and Gynecology | Admitting: Obstetrics and Gynecology

## 2014-09-20 ENCOUNTER — Encounter (HOSPITAL_COMMUNITY): Payer: Self-pay

## 2014-09-20 ENCOUNTER — Other Ambulatory Visit (HOSPITAL_COMMUNITY): Payer: Self-pay | Admitting: Obstetrics and Gynecology

## 2014-09-20 VITALS — BP 112/64 | Temp 97.4°F | Ht 62.0 in | Wt 162.4 lb

## 2014-09-20 DIAGNOSIS — N6320 Unspecified lump in the left breast, unspecified quadrant: Secondary | ICD-10-CM

## 2014-09-20 DIAGNOSIS — N6310 Unspecified lump in the right breast, unspecified quadrant: Secondary | ICD-10-CM

## 2014-09-20 DIAGNOSIS — Z1239 Encounter for other screening for malignant neoplasm of breast: Secondary | ICD-10-CM

## 2014-09-20 DIAGNOSIS — N6322 Unspecified lump in the left breast, upper inner quadrant: Secondary | ICD-10-CM

## 2014-09-20 NOTE — Progress Notes (Signed)
Complaints of left breast lump x 1 month with pain that comes and goes that is greater when touched. Patient rated pain at a 2 out of 10.  Pap Smear:  Pap smear not completed today. Last Pap smear was 09/01/2012 at [facility] and normal. Per patient has no history of an abnormal Pap smear. Last Pap smear result is in EPIC.  Physical exam: Breasts Breasts symmetrical. No skin abnormalities bilateral breasts. No nipple retraction bilateral breasts. No nipple discharge bilateral breasts. No lymphadenopathy. No lumps palpated right breast. Palpated a lump within the left breast at 10 o'clock 4 cm from the nipple. Complaints of left outer and right inner breast tenderness on exam. Referred patient to the [facility] Center of [facility] for diagnostic mammogram and possible left breast ultrasound. Appointment scheduled for Tuesday, September 20, 2014 at 1245.     Pelvic/Bimanual No Pap smear completed today since last Pap smear was 09/01/2012. Pap smear not indicated per [facility] guidelines.

## 2014-09-20 NOTE — Patient Instructions (Addendum)
Explained to  Hannah Luna that she did not need a Pap smear today due to last Pap smear was 09/01/2012. Let her know [facility] will cover Pap smears every 3 years unless has a history of abnormal Pap smears. Let patient know that her next Pap smear is due next September 2016. Told patient if she is still eligible for [facility] can have Pap smear completed through [facility]. Referred patient to the [facility] of [facility] for diagnostic mammogram and possible left breast ultrasound. Appointment scheduled for Tuesday, September 20, 2014 at 1245. Patient aware of appointment and will be there. [name] verbalized understanding.  Brannock, Kathaleen Maser, RN 10:50 AM

## 2014-10-31 ENCOUNTER — Encounter (HOSPITAL_COMMUNITY): Payer: Self-pay

## 2016-01-22 ENCOUNTER — Other Ambulatory Visit (HOSPITAL_COMMUNITY): Payer: Self-pay | Admitting: *Deleted

## 2016-01-22 DIAGNOSIS — N644 Mastodynia: Secondary | ICD-10-CM

## 2016-01-26 ENCOUNTER — Encounter (HOSPITAL_COMMUNITY): Payer: Self-pay

## 2016-01-26 ENCOUNTER — Ambulatory Visit (HOSPITAL_COMMUNITY)
Admission: RE | Admit: 2016-01-26 | Discharge: 2016-01-26 | Disposition: A | Discharge status: Home / Self Care | Payer: Self-pay | Source: Ambulatory Visit | Attending: Obstetrics and Gynecology | Admitting: Obstetrics and Gynecology

## 2016-01-26 ENCOUNTER — Ambulatory Visit
Admission: RE | Admit: 2016-01-26 | Discharge: 2016-01-26 | Disposition: A | Discharge status: Home / Self Care | Payer: No Typology Code available for payment source | Source: Ambulatory Visit | Attending: Obstetrics and Gynecology | Admitting: Obstetrics and Gynecology

## 2016-01-26 VITALS — BP 110/70 | Temp 97.8°F | Ht 62.0 in | Wt 161.0 lb

## 2016-01-26 DIAGNOSIS — N6313 Unspecified lump in the right breast, lower outer quadrant: Secondary | ICD-10-CM

## 2016-01-26 DIAGNOSIS — N644 Mastodynia: Secondary | ICD-10-CM

## 2016-01-26 DIAGNOSIS — Z01419 Encounter for gynecological examination (general) (routine) without abnormal findings: Secondary | ICD-10-CM

## 2016-01-26 NOTE — Progress Notes (Signed)
Complaints of left breast pain x 1 year that comes and goes. Patient rates pain at a 5 out of 10.  Pap Smear: Pap smear completed today. Last Pap smear was 09/01/2012 in [facility] and normal.  Per patient has no history of an abnormal Pap smear. Last Pap smear result is in EPIC.  Physical exam: Breasts Breasts symmetrical. No skin abnormalities bilateral breasts. No nipple retraction bilateral breasts. No nipple discharge bilateral breasts. No lymphadenopathy. No lumps palpated left breast. Palpated a pea sized lump within the right breast at 8 o'clock 2 cm from the nipple that was tender when palpated. Complaints of left outer breast tenderness on exam. Referred patient to the [facility] of [facility] for a diagnostic mammogram. Appointment scheduled for Friday, January 26, 2016 at 1240.  Pelvic/Bimanual   Ext Genitalia No lesions, no swelling and no discharge observed on external genitalia.         Vagina Vagina pink and normal texture. No lesions or discharge observed in vagina.          Cervix Cervix is present. Cervix pink and of normal texture. No discharge observed.     Uterus Uterus is present and palpable. Uterus in normal position and normal size.        Adnexae Bilateral ovaries present and palpable. No tenderness on palpation.          Rectovaginal No rectal exam completed today since patient had no rectal complaints. No skin abnormalities observed on exam.    Smoking History: Patient has never smoked.  Patient Navigation: Patient education provided. Access to services provided for patient through [facility] program. Spanish interpreter provided.   Used Spanish interpreter Marlon Pel from [employer].

## 2016-01-26 NOTE — Patient Instructions (Addendum)
Educational materials on self breast awareness given. Explained to Hannah Luna that [facility] will cover Pap smears and HPV typing every 5 years unless has a history of abnormal Pap smears. Referred patient to the [facility] of [facility] for diagnostic mammogram. Appointment scheduled for Friday, January 26, 2016 at 1240. Patient aware of appointment and will be there. Let patient know will follow up with her within the next couple of weeks with result of Pap smear by phone. Hannah Luna verbalized understanding.  Keosha Rossa, Kathaleen Maser, RN 1:49 PM

## 2016-01-26 NOTE — Addendum Note (Signed)
Encounter addended by: Priscille Heidelberg, RN on: 01/26/2016  2:15 PM<BR>     Documentation filed: Patient Instructions Section

## 2016-01-29 ENCOUNTER — Encounter (HOSPITAL_COMMUNITY): Payer: Self-pay | Admitting: *Deleted

## 2016-01-29 LAB — CYTOLOGY - PAP

## 2016-02-06 ENCOUNTER — Telehealth (HOSPITAL_COMMUNITY): Payer: Self-pay | Admitting: *Deleted

## 2016-02-06 ENCOUNTER — Other Ambulatory Visit (HOSPITAL_COMMUNITY): Payer: Self-pay | Admitting: *Deleted

## 2016-02-06 DIAGNOSIS — B9689 Other specified bacterial agents as the cause of diseases classified elsewhere: Secondary | ICD-10-CM

## 2016-02-06 DIAGNOSIS — N760 Acute vaginitis: Principal | ICD-10-CM

## 2016-02-06 MED ORDER — METRONIDAZOLE 500 MG PO TABS: 500.0000 mg | ORAL_TABLET | Freq: Two times a day (BID) | ORAL | Status: AC

## 2016-02-06 NOTE — Telephone Encounter (Signed)
Patient returned call to [facility]. Advised patient that pap smear was abnormal but HPV was negative. Will repeat pap smear in one year. Pap smear did show bacterial vaginosis and medication was called into pharmacy. Patient should finish all medication and avoid alcohol. Patient voiced understanding. Used interpreter Delorise Royals.

## 2016-02-06 NOTE — Telephone Encounter (Signed)
Telephoned patient at home # and left message to return call to [facility]. Used interpreter Julie Sowell. 

## 2016-07-16 ENCOUNTER — Telehealth (HOSPITAL_COMMUNITY): Payer: Self-pay | Admitting: *Deleted

## 2016-07-16 NOTE — Telephone Encounter (Signed)
Telephoned patient at home # and advised was time for 6 month follow up at the Breast Center. Patient will call and schedule. Used interpreter Albertina SenegalMarly Adams.

## 2016-07-23 ENCOUNTER — Telehealth (HOSPITAL_COMMUNITY): Payer: Self-pay | Admitting: *Deleted

## 2016-07-23 NOTE — Telephone Encounter (Signed)
Patient called Spanish interpreter Hannah Luna stating she needs an appointment with [facility]. Called patient back with interpreter Hannah Luna and explained that she needs a 6 month follow up appointment at the [facility]. Let patient know her [facility] card is good until January 2018. Gave patient the number to the Breast Center and told her she can call directly to schedule appointment. Patient verbalized understanding.

## 2016-07-30 ENCOUNTER — Other Ambulatory Visit: Payer: Self-pay | Admitting: Obstetrics and Gynecology

## 2016-07-30 DIAGNOSIS — N6320 Unspecified lump in the left breast, unspecified quadrant: Secondary | ICD-10-CM

## 2016-08-07 ENCOUNTER — Ambulatory Visit
Admission: RE | Admit: 2016-08-07 | Discharge: 2016-08-07 | Disposition: A | Discharge status: Home / Self Care | Payer: No Typology Code available for payment source | Source: Ambulatory Visit | Attending: Obstetrics and Gynecology | Admitting: Obstetrics and Gynecology

## 2016-08-07 DIAGNOSIS — N6320 Unspecified lump in the left breast, unspecified quadrant: Secondary | ICD-10-CM

## 2017-12-01 ENCOUNTER — Encounter (HOSPITAL_COMMUNITY): Payer: Self-pay

## 2019-07-21 ENCOUNTER — Other Ambulatory Visit: Payer: Self-pay

## 2019-07-21 DIAGNOSIS — Z20822 Contact with and (suspected) exposure to covid-19: Secondary | ICD-10-CM

## 2019-07-25 LAB — NOVEL CORONAVIRUS, NAA: SARS-CoV-2, NAA: NOT DETECTED

## 2020-02-19 ENCOUNTER — Emergency Department (HOSPITAL_COMMUNITY): Payer: No Typology Code available for payment source

## 2020-02-19 ENCOUNTER — Encounter (HOSPITAL_COMMUNITY): Payer: Self-pay | Admitting: *Deleted

## 2020-02-19 ENCOUNTER — Other Ambulatory Visit: Payer: Self-pay

## 2020-02-19 ENCOUNTER — Emergency Department (HOSPITAL_COMMUNITY)
Admission: EM | Admit: 2020-02-19 | Discharge: 2020-02-20 | Disposition: A | Discharge status: Home / Self Care | Payer: No Typology Code available for payment source | Attending: Emergency Medicine | Admitting: Emergency Medicine

## 2020-02-19 DIAGNOSIS — Y9389 Activity, other specified: Secondary | ICD-10-CM | POA: Insufficient documentation

## 2020-02-19 DIAGNOSIS — R109 Unspecified abdominal pain: Secondary | ICD-10-CM | POA: Insufficient documentation

## 2020-02-19 DIAGNOSIS — Y998 Other external cause status: Secondary | ICD-10-CM | POA: Insufficient documentation

## 2020-02-19 DIAGNOSIS — Y92410 Unspecified street and highway as the place of occurrence of the external cause: Secondary | ICD-10-CM | POA: Insufficient documentation

## 2020-02-19 DIAGNOSIS — Z79899 Other long term (current) drug therapy: Secondary | ICD-10-CM | POA: Diagnosis not present

## 2020-02-19 DIAGNOSIS — R0789 Other chest pain: Secondary | ICD-10-CM | POA: Diagnosis present

## 2020-02-19 DIAGNOSIS — M545 Low back pain: Secondary | ICD-10-CM | POA: Insufficient documentation

## 2020-02-19 LAB — COMPREHENSIVE METABOLIC PANEL
ALT: 30 U/L (ref 0–44)
AST: 20 U/L (ref 15–41)
Albumin: 4.3 g/dL (ref 3.5–5.0)
Alkaline Phosphatase: 59 U/L (ref 38–126)
Anion gap: 10 (ref 5–15)
BUN: 8 mg/dL (ref 6–20)
CO2: 23 mmol/L (ref 22–32)
Calcium: 9.4 mg/dL (ref 8.9–10.3)
Chloride: 105 mmol/L (ref 98–111)
Creatinine, Ser: 0.74 mg/dL (ref 0.44–1.00)
GFR calc Af Amer: >60 mL/min (ref >60)
GFR calc non Af Amer: >60 mL/min (ref >60)
Glucose, Bld: 109 mg/dL — ABNORMAL HIGH (ref 70–99)
Potassium: 3.9 mmol/L (ref 3.5–5.1)
Sodium: 138 mmol/L (ref 135–145)
Total Bilirubin: 0.4 mg/dL (ref 0.3–1.2)
Total Protein: 7.5 g/dL (ref 6.5–8.1)

## 2020-02-19 LAB — CBC
HCT: 42.8 % (ref 36.0–46.0)
Hemoglobin: 14.1 g/dL (ref 12.0–15.0)
MCH: 30.9 pg (ref 26.0–34.0)
MCHC: 32.9 g/dL (ref 30.0–36.0)
MCV: 93.9 fL (ref 80.0–100.0)
Platelets: 347 10*3/uL (ref 150–400)
RBC: 4.56 MIL/uL (ref 3.87–5.11)
RDW: 12 % (ref 11.5–15.5)
WBC: 10.3 10*3/uL (ref 4.0–10.5)
nRBC: 0 % (ref 0.0–0.2)

## 2020-02-19 LAB — I-STAT BETA HCG BLOOD, ED (MC, WL, AP ONLY): I-stat hCG, quantitative: <5 m[IU]/mL (ref <5)

## 2020-02-19 LAB — ETHANOL: Alcohol, Ethyl (B): <10 mg/dL (ref <10)

## 2020-02-19 MED ORDER — FENTANYL CITRATE (PF) 100 MCG/2ML IJ SOLN
50.0000 ug | Freq: Once | INTRAMUSCULAR | Status: AC
  Administered 2020-02-19: 50 ug via INTRAVENOUS
  Filled 2020-02-19: qty 2

## 2020-02-19 NOTE — ED Notes (Signed)
cxr at bedside

## 2020-02-19 NOTE — ED Notes (Signed)
Pain meds given per MAR. Name/DOB verified with pt 

## 2020-02-19 NOTE — ED Notes (Signed)
Pt now guarding her upper abdomen pacing in the waiting area, taken back for EKG, will take to treatment room.
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
## 2020-02-20 ENCOUNTER — Emergency Department (HOSPITAL_COMMUNITY): Payer: No Typology Code available for payment source

## 2020-02-20 LAB — URINALYSIS, ROUTINE W REFLEX MICROSCOPIC
Bilirubin Urine: NEGATIVE
Glucose, UA: NEGATIVE mg/dL
Ketones, ur: NEGATIVE mg/dL
Nitrite: NEGATIVE
Protein, ur: 30 mg/dL — AB
Specific Gravity, Urine: 1.029 (ref 1.005–1.030)
pH: 5 (ref 5.0–8.0)

## 2020-02-20 MED ORDER — IOHEXOL 300 MG/ML  SOLN
100.0000 mL | Freq: Once | INTRAMUSCULAR | Status: AC | PRN
  Administered 2020-02-20: 100 mL via INTRAVENOUS

## 2020-02-20 MED ORDER — KETOROLAC TROMETHAMINE 30 MG/ML IJ SOLN
30.0000 mg | Freq: Once | INTRAMUSCULAR | Status: AC
  Administered 2020-02-20: 30 mg via INTRAVENOUS
  Filled 2020-02-20: qty 1

## 2020-02-20 MED ORDER — IBUPROFEN 800 MG PO TABS: 800.0000 mg | ORAL_TABLET | Freq: Three times a day (TID) | ORAL | 0 refills | Status: AC | PRN

## 2020-02-20 NOTE — ED Notes (Signed)
toradol given per MAR. Name/DOB verified with pt 

## 2020-02-20 NOTE — ED Notes (Addendum)
Patient verbalizes understanding of discharge instructions and prescription medications. Opportunity for questioning and answers were provided. All questions answered completely. PIV removed, catheter intact. Site dressed with gauze and tape. Armband removed by staff, pt discharged from ED. Ambulatory with strong, steady gait

## 2020-02-20 NOTE — ED Notes (Signed)
Pt taken to CT.

## 2020-02-20 NOTE — Discharge Instructions (Signed)
You may alternate Tylenol 1000 mg every 6 hours as needed for pain and Ibuprofen 800 mg every 8 hours as needed for pain.  Please take Ibuprofen with food.  Do not take more than 4000 mg of Tylenol (acetaminophen) in a 24 hour period.     Steps to find a Primary Care Provider (PCP):  Call [phone] or [phone] to access "[facility] Find a Doctor Service."  2.  You may also go on the [facility] website at [email or web address]  3.  [facility] also frequently accepts new patients.  [facility]  [address] [place] [address] [phone]  4.  There are also multiple [facility] and [facility] practices throughout the [place] that are frequently accepting new patients. You may find a clinic that is close to your home and contact them.  [email or web address] [phone]  [facility] [email or web address]  [email or web address] [phone]  [email or web address] [phone]  5.  Local Health Departments also can provide primary care services.  [facility]  [address] [place] [address] [phone]  [facility] [address] [place] [address] [phone]  [facility] [address]  [place] [address] [phone]   

## 2020-02-20 NOTE — ED Notes (Signed)
Pt ambulatory to and from [facility]. Urine collected, labeled with 2 pt identifiers, and sent to lab

## 2021-07-31 IMAGING — DX DG CHEST 1V PORT
1 series · 1 of 1 positions shown · non-contrast
Comparison: None.

CLINICAL DATA: Chest pain.

EXAM:
PORTABLE CHEST 1 VIEW

[chest ap]
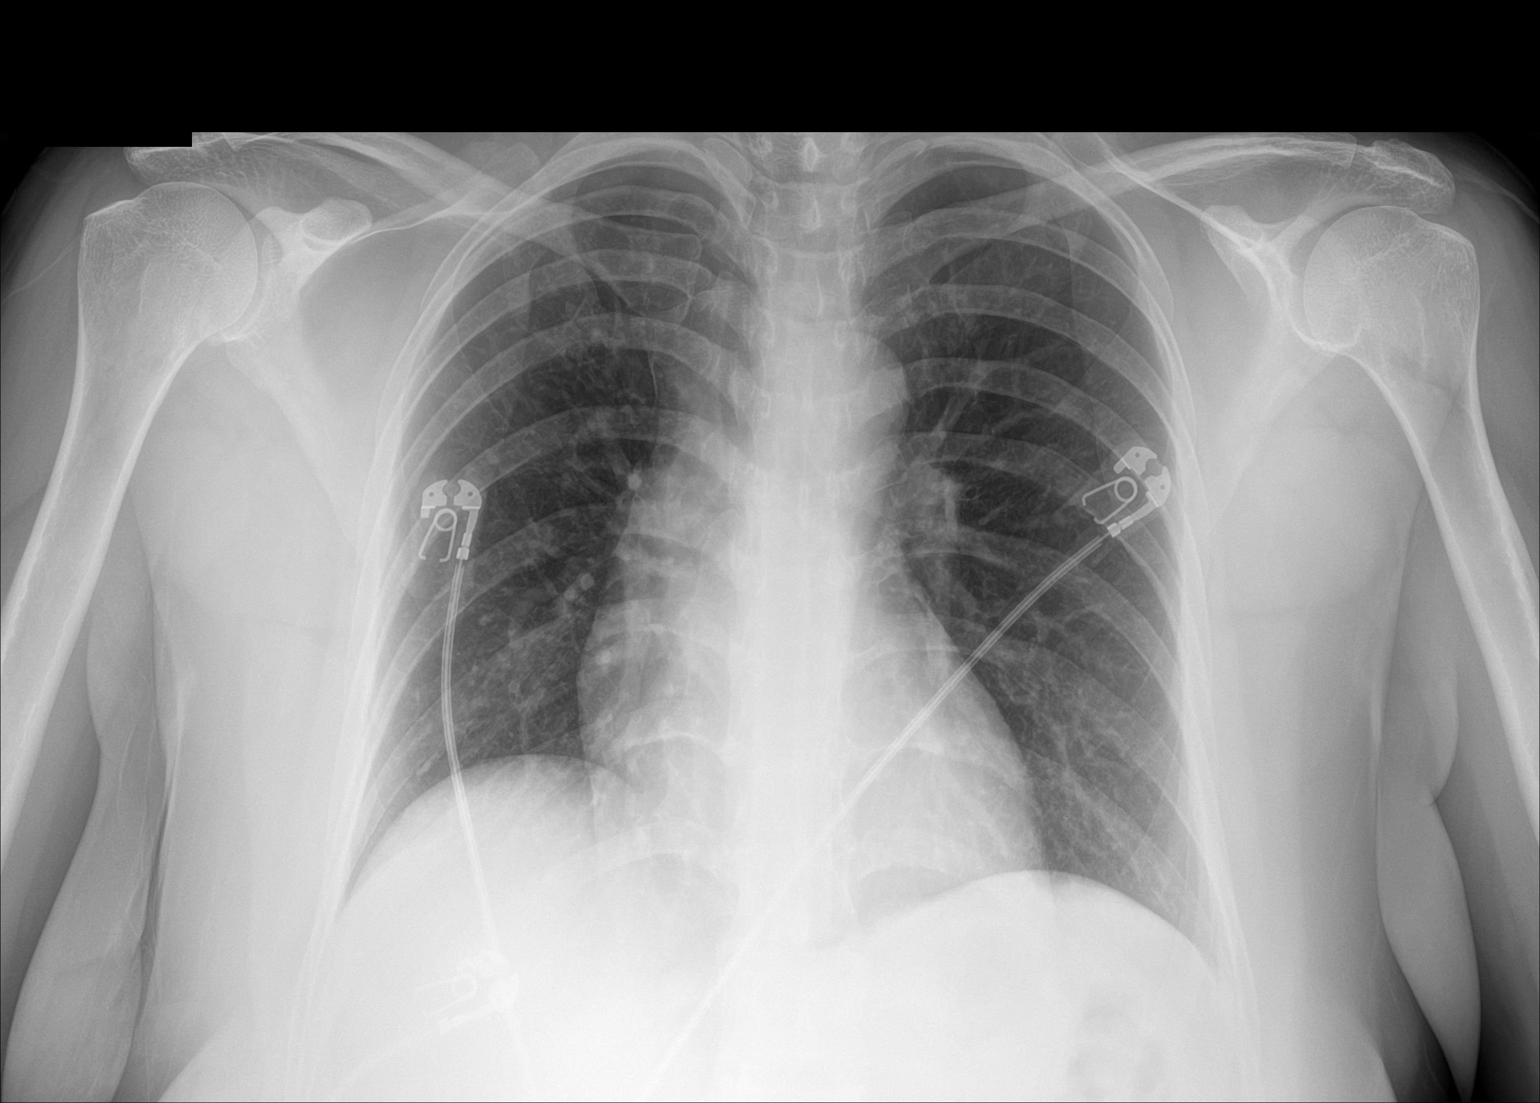

[1 of 1 positions shown; findings below may reference images not displayed]

FINDINGS: The heart size and mediastinal contours are within normal limits.
Both lungs are clear. The visualized skeletal structures are
unremarkable.
IMPRESSION: No active disease.
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
## 2022-05-14 ENCOUNTER — Other Ambulatory Visit: Payer: Self-pay

## 2022-05-14 DIAGNOSIS — Z1231 Encounter for screening mammogram for malignant neoplasm of breast: Secondary | ICD-10-CM

## 2022-06-18 ENCOUNTER — Ambulatory Visit: Payer: Self-pay

## 2022-06-18 DIAGNOSIS — Z1211 Encounter for screening for malignant neoplasm of colon: Secondary | ICD-10-CM
